# Patient Record
Sex: Female | Born: 1995 | Race: White | Hispanic: No | Marital: Single | Smoking: Never smoker
Health system: Southern US, Community
[De-identification: ages and names within clinical notes are randomized; demographics above are authoritative.]

## PROBLEM LIST (undated history)

## (undated) ENCOUNTER — Inpatient Hospital Stay (HOSPITAL_COMMUNITY): Payer: Self-pay

## (undated) DIAGNOSIS — A749 Chlamydial infection, unspecified: Secondary | ICD-10-CM

## (undated) HISTORY — PX: NO PAST SURGERIES: SHX2092

---

## 2017-05-16 ENCOUNTER — Inpatient Hospital Stay (HOSPITAL_COMMUNITY): Payer: Medicaid Other

## 2017-05-16 ENCOUNTER — Encounter (HOSPITAL_COMMUNITY): Payer: Self-pay | Admitting: *Deleted

## 2017-05-16 ENCOUNTER — Inpatient Hospital Stay (HOSPITAL_COMMUNITY)
Admission: AD | Admit: 2017-05-16 | Discharge: 2017-05-16 | Disposition: A | Discharge status: Home / Self Care | Payer: Medicaid Other | Source: Ambulatory Visit | Attending: Obstetrics & Gynecology | Admitting: Obstetrics & Gynecology

## 2017-05-16 ENCOUNTER — Telehealth: Payer: Self-pay | Admitting: Student

## 2017-05-16 ENCOUNTER — Other Ambulatory Visit: Payer: Self-pay | Admitting: Student

## 2017-05-16 DIAGNOSIS — O209 Hemorrhage in early pregnancy, unspecified: Secondary | ICD-10-CM | POA: Insufficient documentation

## 2017-05-16 DIAGNOSIS — O468X1 Other antepartum hemorrhage, first trimester: Secondary | ICD-10-CM | POA: Diagnosis not present

## 2017-05-16 DIAGNOSIS — Z3A01 Less than 8 weeks gestation of pregnancy: Secondary | ICD-10-CM | POA: Insufficient documentation

## 2017-05-16 DIAGNOSIS — N939 Abnormal uterine and vaginal bleeding, unspecified: Secondary | ICD-10-CM

## 2017-05-16 DIAGNOSIS — O418X10 Other specified disorders of amniotic fluid and membranes, first trimester, not applicable or unspecified: Secondary | ICD-10-CM

## 2017-05-16 HISTORY — DX: Chlamydial infection, unspecified: A74.9

## 2017-05-16 LAB — WET PREP, GENITAL
SPERM: NONE SEEN
TRICH WET PREP: NONE SEEN
Yeast Wet Prep HPF POC: NONE SEEN

## 2017-05-16 LAB — URINALYSIS, ROUTINE W REFLEX MICROSCOPIC
Bilirubin Urine: NEGATIVE
GLUCOSE, UA: NEGATIVE mg/dL
HGB URINE DIPSTICK: NEGATIVE
KETONES UR: 20 mg/dL — AB
NITRITE: NEGATIVE
PH: 5 (ref 5.0–8.0)
Protein, ur: 30 mg/dL — AB
Specific Gravity, Urine: 1.023 (ref 1.005–1.030)

## 2017-05-16 LAB — POCT PREGNANCY, URINE: Preg Test, Ur: POSITIVE — AB

## 2017-05-16 MED ORDER — CEPHALEXIN 500 MG PO CAPS: 500.0000 mg | ORAL_CAPSULE | Freq: Four times a day (QID) | ORAL | 0 refills | Status: AC

## 2017-05-16 MED ORDER — ONDANSETRON HCL 8 MG PO TABS: 8.0000 mg | ORAL_TABLET | Freq: Three times a day (TID) | ORAL | 1 refills | Status: AC | PRN

## 2017-05-16 MED ORDER — METRONIDAZOLE 500 MG PO TABS: 500.0000 mg | ORAL_TABLET | Freq: Two times a day (BID) | ORAL | 0 refills | Status: AC

## 2017-05-16 NOTE — Discharge Instructions (Signed)
Pregnancy and Urinary Tract Infection What is a urinary tract infection? A urinary tract infection (UTI) is an infection of any part of the urinary tract. This includes the kidneys, the tubes that connect your kidneys to your bladder (ureters), the bladder, and the tube that carries urine out of your body (urethra). These organs make, store, and get rid of urine in the body. A UTI can be a bladder infection (cystitis) or a kidney infection (pyelonephritis). This infection may be caused by fungi, viruses, and bacteria. Bacteria are the most common cause of UTIs. You are more likely to develop a UTI during pregnancy because:  The physical and hormonal changes your body goes through can make it easier for bacteria to get into your urinary tract.  Your growing baby puts pressure on your uterus and can affect urine flow.  Does a UTI place my baby at risk? An untreated UTI during pregnancy could lead to a kidney infection, which can cause health problems that could affect your baby. Possible complications of an untreated UTI include:  Having your baby before 37 weeks of pregnancy (premature).  Having a baby with a low birth weight.  Developing high blood pressure during pregnancy (preeclampsia).  What are the symptoms of a UTI? Symptoms of a UTI include:  Fever.  Frequent urination or passing small amounts of urine frequently.  Needing to urinate urgently.  Pain or a burning sensation with urination.  Urine that smells bad or unusual.  Cloudy urine.  Pain in the lower abdomen or back.  Trouble urinating.  Blood in the urine.  Vomiting or being less hungry than normal.  Diarrhea or abdominal pain.  Vaginal discharge.  What are the treatment options for a UTI during pregnancy? Treatment for this condition may include:  Antibiotic medicines that are safe to take during pregnancy.  Other medicines to treat less common causes of UTI.  How can I prevent a UTI?  To prevent a  UTI:  Go to the bathroom as soon as you feel the need.  Always wipe from front to back.  Wash your genital area with soap and warm water daily.  Empty your bladder before and after sex.  Wear cotton underwear.  Limit your intake of high sugar foods or drinks, such as regular soda, juice, and sweets.  Drink 6-8 glasses of water daily.  Do not wear tight-fitting pants.  Do not douche or use deodorant sprays.  Do not drink alcohol, caffeine, or carbonated drinks. These can irritate the bladder.  Contact a health care provider if:  Your symptoms do not improve or get worse.  You have a fever after two days of treatment.  You have a rash.  You have abnormal vaginal discharge.  You have back or side pain.  You have chills.  You have nausea and vomiting. Get help right away if: Seek immediate medical care if you are pregnant and:  You feel contractions in your uterus.  You have lower belly pain.  You have a gush of fluid from your vagina.  You have blood in your urine.  You are vomiting and cannot keep down any medicines or water.  This information is not intended to replace advice given to you by your health care provider. Make sure you discuss any questions you have with your health care provider. Document Released: 06/18/2010 Document Revised: 02/05/2016 Document Reviewed: 01/12/2015 Elsevier Interactive Patient Education  2017 [employer] Subchorionic Hematoma A subchorionic hematoma is a gathering of blood between the  outer wall of the placenta and the inner wall of the womb (uterus). The placenta is the organ that connects the fetus to the wall of the uterus. The placenta performs the feeding, breathing (oxygen to the fetus), and waste removal (excretory work) of the fetus. Subchorionic hematoma is the most common abnormality found on a result from ultrasonography done during the first trimester or early second trimester of pregnancy. If there has been little  or no vaginal bleeding, early small hematomas usually shrink on their own and do not affect your baby or pregnancy. The blood is gradually absorbed over 1-2 weeks. When bleeding starts later in pregnancy or the hematoma is larger or occurs in an older pregnant woman, the outcome may not be as good. Larger hematomas may get bigger, which increases the chances for miscarriage. Subchorionic hematoma also increases the risk of premature detachment of the placenta from the uterus, preterm (premature) labor, and stillbirth. Follow these instructions at home:  Stay on bed rest if your health care provider recommends this. Although bed rest will not prevent more bleeding or prevent a miscarriage, your health care provider may recommend bed rest until you are advised otherwise.  Avoid heavy lifting (more than 10 lb [4.5 kg]), exercise, sexual intercourse, or douching as directed by your health care provider.  Keep track of the number of pads you use each day and how soaked (saturated) they are. Write down this information.  Do not use tampons.  Keep all follow-up appointments as directed by your health care provider. Your health care provider may ask you to have follow-up blood tests or ultrasound tests or both. Get help right away if:  You have severe cramps in your stomach, back, abdomen, or pelvis.  You have a fever.  You pass large clots or tissue. Save any tissue for your health care provider to look at.  Your bleeding increases or you become lightheaded, feel weak, or have fainting episodes. This information is not intended to replace advice given to you by your health care provider. Make sure you discuss any questions you have with your health care provider. Document Released: 06/08/2006 Document Revised: 07/30/2015 Document Reviewed: 09/20/2012 Elsevier Interactive Patient Education  2017 [employer]

## 2017-05-16 NOTE — MAU Note (Signed)
Pt was having some abdominal pain and bleeding, no clots, heavy yesterday and filled 6 pads in 10 hours. Just had spotting today. No clots seen, having lower abdominal pain 7/10.  Having some n/v

## 2017-05-16 NOTE — Telephone Encounter (Signed)
Called patient to talk to her about wet prep results. Explained Flagyl and how to take; all questions answered.  Patient plans to pick up all her RX this afternoon.   Luna KitchensKathryn Kooistra

## 2017-05-16 NOTE — MAU Provider Note (Addendum)
History     CSN: [id number]  Arrival date and time: 05/16/17 1124   None     Chief Complaint  Patient presents with  . Abdominal Pain  . Vaginal Bleeding   Pt is a 22 y.o. Female G1P0 who presents with lower abdominal pain and spotting. She had an insidious onset of symptoms that started yesterday morning. She administered 4 home pregnancy test which were all positive. Ms. Baray then went to her OB/GYN office, but was unable to receive an ultrasound due to systems being down. She the went to [facility] and [place] [facility], who both confirmed a positive pregnancy test through urine samples.   Her abdominal pain is constant ranging from sharp to cramping.  She endorses nausea, vomiting, and vaginal bleeding. During a 10 hour span yesterday she used 6 pads. Today the patient reports light spotting.  She denies dizziness, fatigue, lethargy, or difficulty breathing.   Her last menstrual period was around January 1st, 2019 and lasted for 7 days. She experienced severe cramping and lower abdominal pain during this period. Heavy bleeding was present requiring pad changes every hour for the entire week. Prior to her period she experienced breast soreness and bloating. Ms. Perfecto reports a history of 5 menstruations total. She has been receiving Depo injections for the past 6 years. Recently switched to pill form of birth control.   Additionally she reports symptoms of another UTI. She has a h/o of multiple UTI's over the past few years. She endorses increased frequency, urgency, nocturia, and dysuria. She denies hematuria, fever, chills.  The patient had chest pain that occurred yesterday, but has since resolved. It was located on the right side around ribs 3-5. It was sharp and stabbing. The pain was no reproduced with movement. There is no tenderness to touch. She denies shortness of breath, lightheadedness, wheezing, or syncopal episodes.    OB History    Gravida Para Term Preterm AB Living   1              SAB TAB Ectopic Multiple Live Births                  Past Medical History:  Diagnosis Date  . Chlamydia     Past Surgical History:  Procedure Laterality Date  . NO PAST SURGERIES      No family history on file.  Social History   Tobacco Use  . Smoking status: Never Smoker  . Smokeless tobacco: Never Used  Substance Use Topics  . Alcohol use: No    Frequency: Never  . Drug use: No    Allergies: No Known Allergies  Medications Prior to Admission  Medication Sig Dispense Refill Last Dose  . ibuprofen (ADVIL,MOTRIN) 200 MG tablet Take 400 mg by mouth every 6 (six) hours as needed for headache.   05/10/2017    Review of Systems  Constitutional: Negative for chills and fever.  HENT: Negative for nosebleeds.   Respiratory: Negative for shortness of breath.   Cardiovascular: Negative for chest pain.  Gastrointestinal: Positive for abdominal pain, nausea and vomiting. Negative for blood in stool.  Genitourinary: Positive for dysuria, urgency and vaginal bleeding. Negative for hematuria.  Neurological: Negative for dizziness, syncope and light-headedness.   Physical Exam   Blood pressure 128/83, pulse 95, temperature 98.5 F (36.9 C), resp. rate 16, weight 63.5 kg (140 lb), last menstrual period 03/07/2017, SpO2 96 %.  Physical Exam  Constitutional: She appears well-developed and well-nourished.  HENT:  Head:  Normocephalic and atraumatic.  Eyes: Pupils are equal, round, and reactive to light.  Neck: Normal range of motion.  Cardiovascular: Normal rate, regular rhythm and normal heart sounds.  Respiratory: Effort normal and breath sounds normal.  GI: Soft. She exhibits no distension. There is tenderness.    MAU Course  Procedures  MDM Pt is a 22 y.o. Female G1P0 who presents with lower abdominal pain and spotting. Pt declined blood draws (CMP, CBC, Blood type). Transabdominal and transvaginal ultrasounds were performed confirming single Intrauterine  gestational sac and small to moderate subchronic hemorrhage. Pt declined vaginal swab for STD's by staff. Pt performed self swab for chlamydia and gonorrhea. Results are pending.  She is also experiencing dysuria, nocturia, and increased frequency with urination. She reports a history significant for UTI's. Urinalysis showed large leukocytes and TNTC WBC. She was prescribed cephalexin. Chest pain has resolved as of last night. No shortness of breath, chest pain, syncope, or dizziness present.   Assessment and Plan  6 weeks of gestational pregnancy  1. Wet prep (Gonorrhea/Chlmydia pending), Urine culture - Pending 2. Rx: Zofran for nausea, Cephalexin for UTI 3. Pt educated signs and symptoms consistent with prolonged bleeding. Also consume small frequent meals. Crackers and ginger ale may be more palatable. 4. Pt advised to return to MAU or OBGYN if symptoms worsen or no improvement 5. Discharged in stable condition  Anitra Lauth 05/16/2017, 1:57 PM   CNM attestation:  I have seen and examined this patient and agree with above documentation in the PA students's note.   Darnella Sargeant is a 22 y.o. G1P0 at [redacted]w[redacted]d reporting dysuria, and VB, which has since subsided. She is very anxious for an [place].   PE: Patient Vitals for the past 24 hrs:  BP Temp Pulse Resp SpO2 Weight  05/16/17 1528 117/65 - 96 16 - -  05/16/17 1148 128/83 98.5 F (36.9 C) 95 16 96 % -  05/16/17 1145 - - - - - 140 lb (63.5 kg)   Gen: calm comfortable, NAD Resp: normal effort, no distress Heart: Regular rate  ROS, labs, PMH reviewed  Orders Placed This Encounter  Procedures  . Wet prep, genital  . Culture, OB Urine  . US OB Transvaginal  . US OB Comp Less 14 Wks  . Urinalysis, Routine w reflex microscopic  . Pregnancy, urine POC  . Discharge patient Discharge disposition: 01-Home or Self Care; Discharge patient date: 05/16/2017  . Discharge patient Discharge disposition: 01-Home or Self Care; Discharge patient  date: 05/16/2017   Meds ordered this encounter  Medications  . cephALEXin (KEFLEX) 500 MG capsule    Sig: Take 1 capsule (500 mg total) by mouth 4 (four) times daily.    Dispense:  28 capsule    Refill:  0    Order Specific Question:   Supervising Provider    Answer:   Reva Bores [[id number]]  . ondansetron (ZOFRAN) 8 MG tablet    Sig: Take 1 tablet (8 mg total) by mouth every 8 (eight) hours as needed for nausea or vomiting.    Dispense:  30 tablet    Refill:  1    Order Specific Question:   Supervising Provider    Answer:   Samara Snide    MDM -Patient adamanent that she does not want blood draw, even as I explained to her the need for monitoring in case she has an ectopic pregnancy. Patient agreed to vaginal [place] first, before blood draw. She states that  she does not want vaginal US but then later agreed. She wants us to "run that jelly over my stomach and give me my pictures". I explained our concern about ectopic and agreed with US plan, labs can wait at this time.  -wet prep pending patient left before results were available.  -GC chlamydia pending.  -Urine sent for culture but will treat with Keflex.   Assessment: 1. Subchorionic hemorrhage of placenta in first trimester, single or unspecified fetus   2. Vaginal bleeding     Plan: - Discharge home in stable condition./ - Follow-up as scheduled at your doctor's office for next prenatal visit or sooner as needed if symptoms worsen. - Return to maternity admissions if symptoms worsen  Marylene LandKooistra, Kathryn Lorraine, [facility] HospitalCNM 05/16/2017 4:33 PM

## 2017-05-17 LAB — GC/CHLAMYDIA PROBE AMP (~~LOC~~) NOT AT ARMC
CHLAMYDIA, DNA PROBE: NEGATIVE
NEISSERIA GONORRHEA: NEGATIVE

## 2017-05-17 LAB — CULTURE, OB URINE: CULTURE: NO GROWTH

## 2018-03-31 ENCOUNTER — Encounter (HOSPITAL_COMMUNITY): Payer: Self-pay

## 2019-06-08 IMAGING — US US OB TRANSVAGINAL
1 series · 15 of 28 positions shown · non-contrast
Comparison: None.

CLINICAL DATA: Pregnant, heavy vaginal bleeding/spotting, abdominal
pain

EXAM:
OBSTETRIC <14 WK US AND TRANSVAGINAL OB US
TECHNIQUE: Both transabdominal and transvaginal ultrasound examinations were
performed for complete evaluation of the gestation as well as the
maternal uterus, adnexal regions, and pelvic cul-de-sac.
Transvaginal technique was performed to assess early pregnancy.

[Series 1: us ob transvaginal · 15 of 85 slices shown]
[im 1/85]
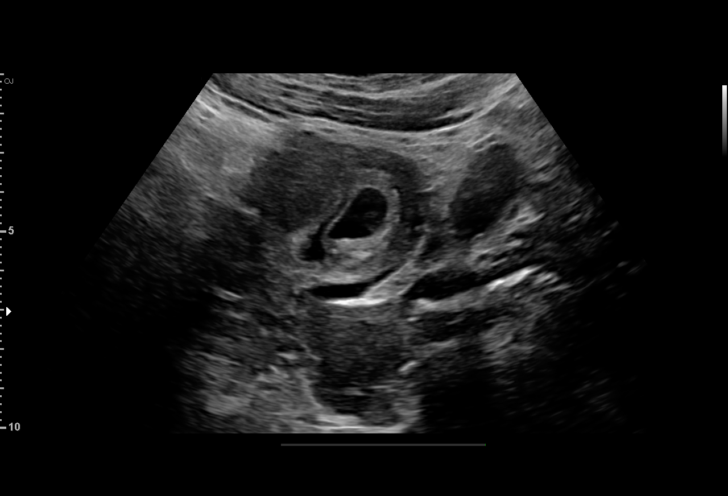
[im 7/85]
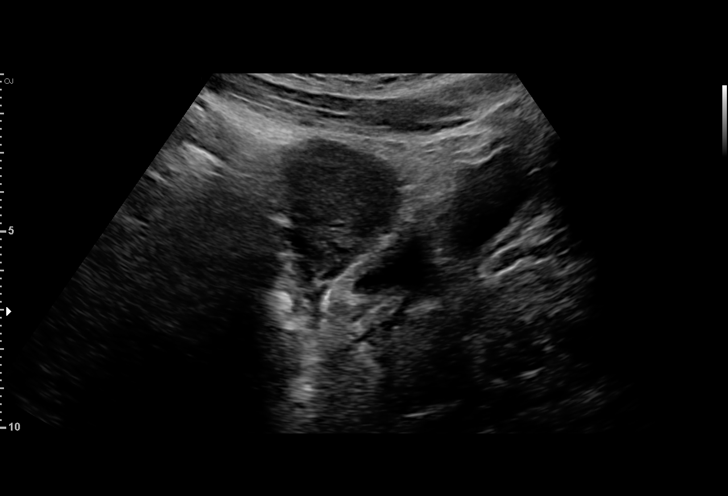
[im 13/85]
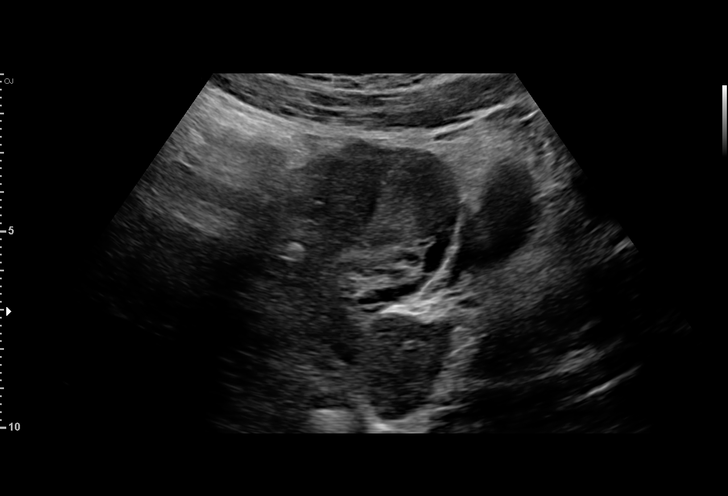
[im 19/85]
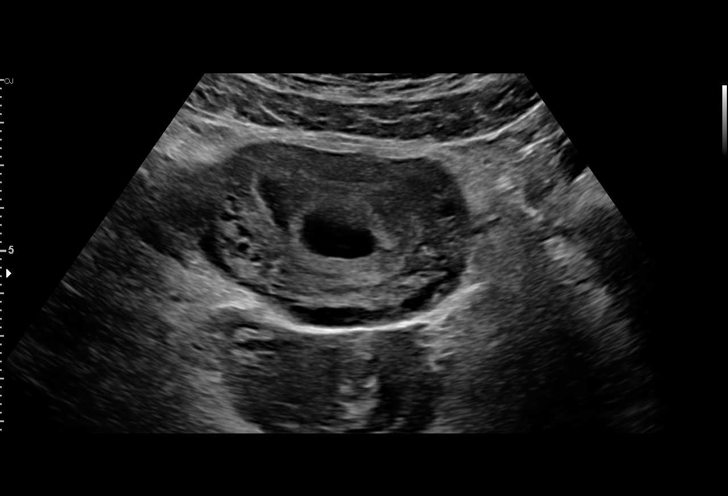
[im 25/85]
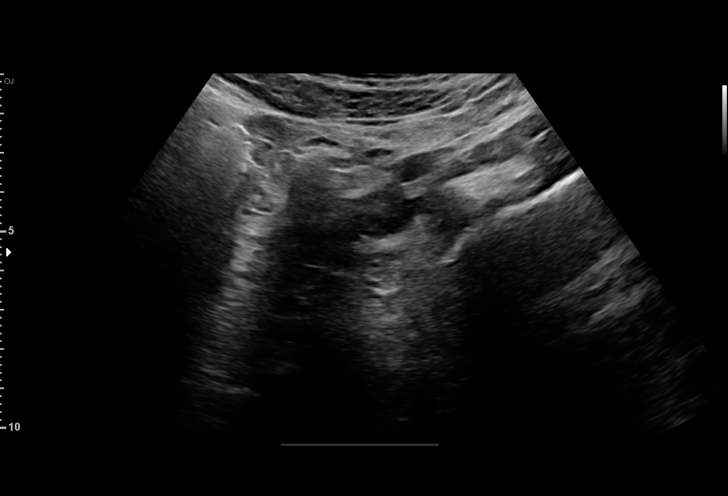
[im 32/85]
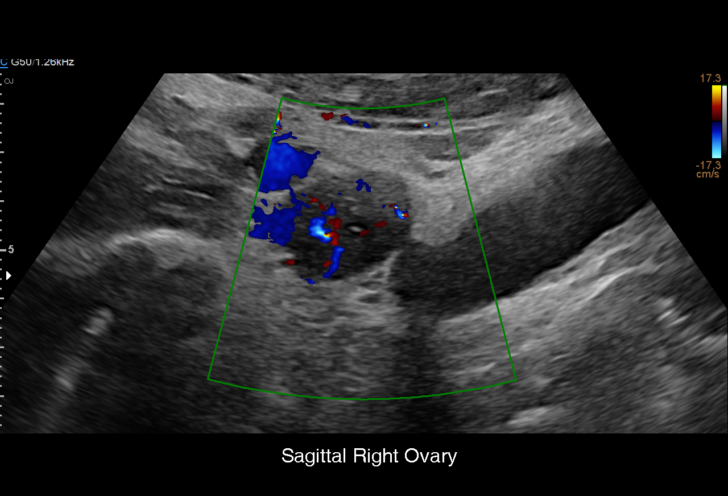
[im 38/85]
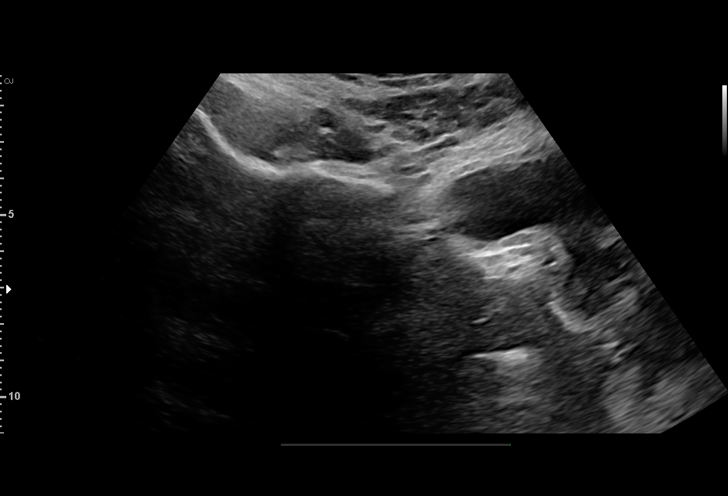
[im 44/85]
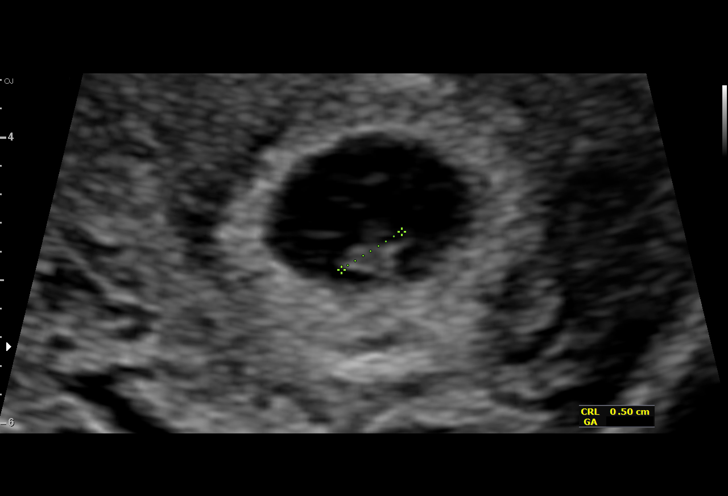
[im 47/85]
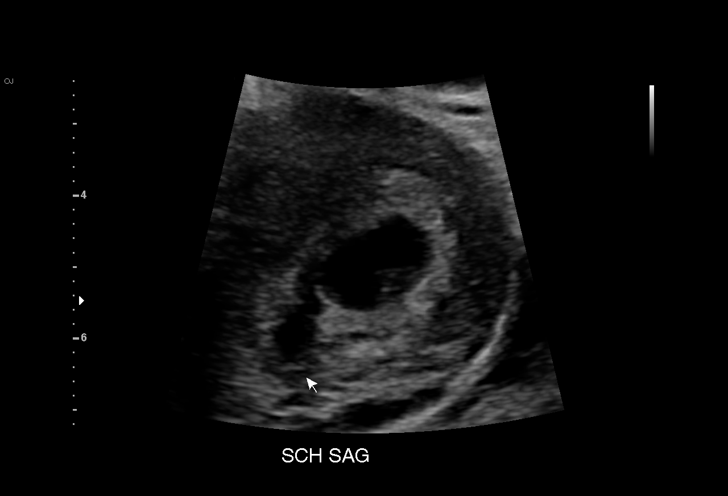
[im 53/85]
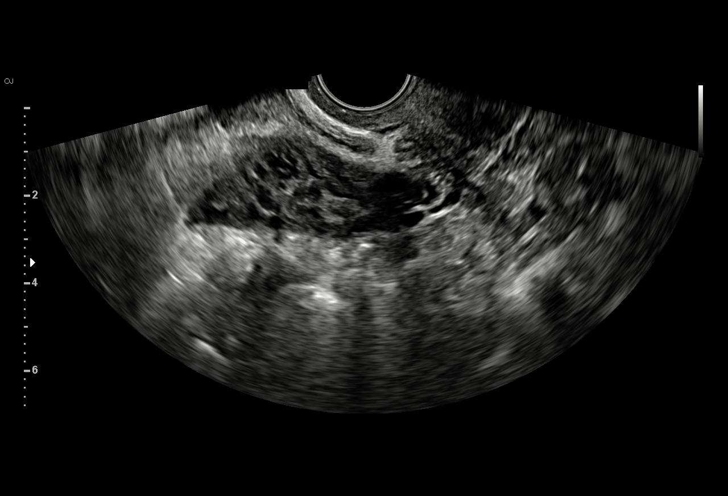
[im 60/85]
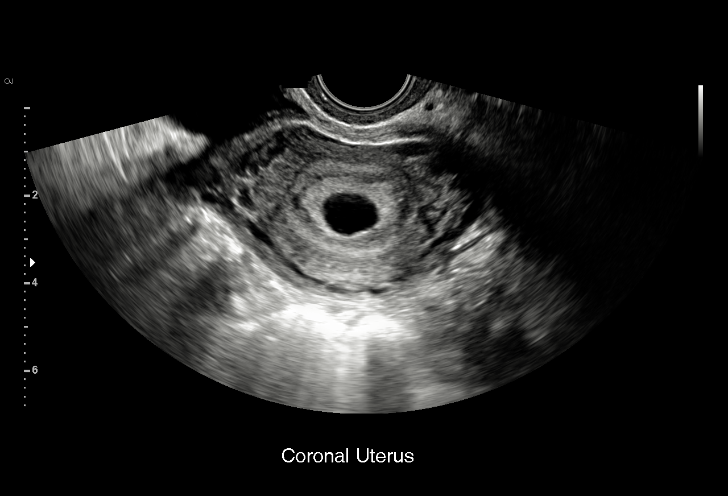
[im 66/85]
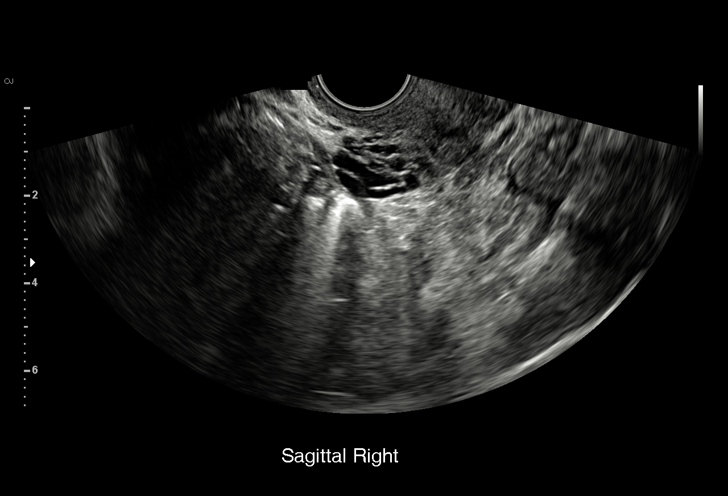
[im 72/85]
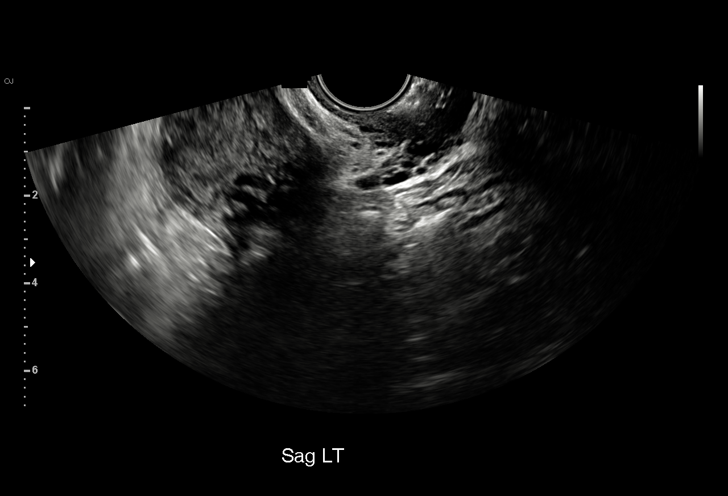
[im 78/85]
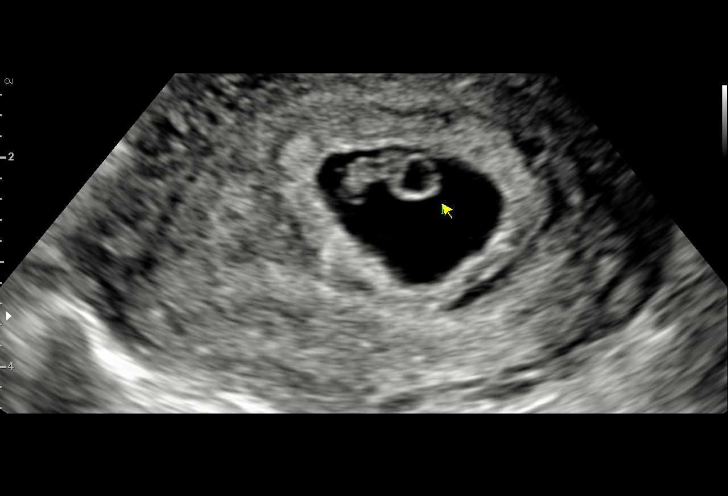
[im 85/85]
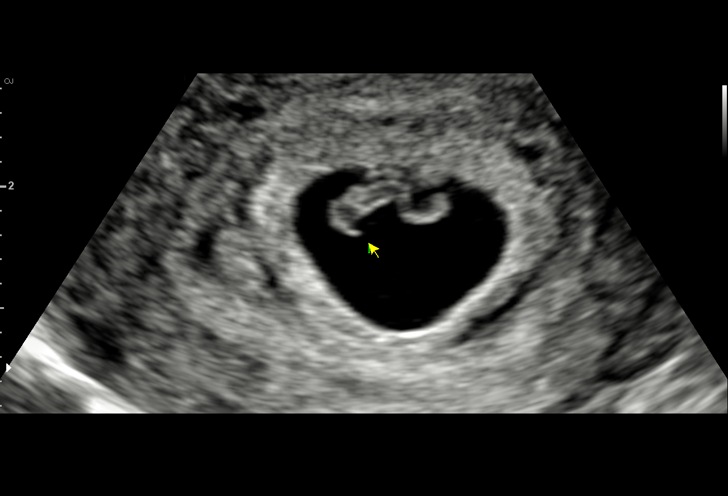

[15 of 28 positions shown; findings below may reference images not displayed]

FINDINGS: Intrauterine gestational sac: Single

Yolk sac:  Visualized.

Embryo:  Visualized.

Cardiac Activity: Visualized.

Heart Rate: 127 bpm

CRL:  7.0 mm   6 w   3 d                  US EDC: 01/06/2018

Subchorionic hemorrhage:  Small to moderate subchronic hemorrhage.

Maternal uterus/adnexae: Right ovary is within normal limits.

Left ovary is not discretely visualized.

No free fluid.
IMPRESSION: Single live intrauterine gestation, with estimated gestational age 6
weeks 3 days by crown-rump length, as described above.
